# Patient Record
Sex: Male | Born: 1980 | Race: Black or African American | Hispanic: No | Marital: Single | Smoking: Former smoker
Health system: Southern US, Community
[De-identification: ages and names within clinical notes are randomized; demographics above are authoritative.]

## PROBLEM LIST (undated history)

## (undated) DIAGNOSIS — W409XXA Explosion of unspecified explosive materials, initial encounter: Secondary | ICD-10-CM

## (undated) HISTORY — PX: EYE SURGERY: SHX253

---

## 2005-05-17 ENCOUNTER — Emergency Department (HOSPITAL_COMMUNITY): Admission: EM | Admit: 2005-05-17 | Discharge: 2005-05-17 | Payer: Self-pay | Admitting: Emergency Medicine

## 2009-10-08 ENCOUNTER — Emergency Department (HOSPITAL_BASED_OUTPATIENT_CLINIC_OR_DEPARTMENT_OTHER): Admission: EM | Admit: 2009-10-08 | Discharge: 2009-10-08 | Payer: Self-pay | Admitting: Emergency Medicine

## 2014-01-11 ENCOUNTER — Emergency Department (HOSPITAL_BASED_OUTPATIENT_CLINIC_OR_DEPARTMENT_OTHER)
Admission: EM | Admit: 2014-01-11 | Discharge: 2014-01-12 | Disposition: A | Discharge status: Home / Self Care | Payer: Self-pay | Attending: Emergency Medicine | Admitting: Emergency Medicine

## 2014-01-11 ENCOUNTER — Encounter (HOSPITAL_BASED_OUTPATIENT_CLINIC_OR_DEPARTMENT_OTHER): Payer: Self-pay | Admitting: Emergency Medicine

## 2014-01-11 DIAGNOSIS — N390 Urinary tract infection, site not specified: Secondary | ICD-10-CM | POA: Insufficient documentation

## 2014-01-11 DIAGNOSIS — F17200 Nicotine dependence, unspecified, uncomplicated: Secondary | ICD-10-CM | POA: Insufficient documentation

## 2014-01-11 DIAGNOSIS — A630 Anogenital (venereal) warts: Secondary | ICD-10-CM | POA: Insufficient documentation

## 2014-01-11 HISTORY — DX: Explosion of unspecified explosive materials, initial encounter: W40.9XXA

## 2014-01-11 LAB — URINALYSIS W MICROSCOPIC + REFLEX CULTURE
Bilirubin Urine: NEGATIVE
GLUCOSE, UA: NEGATIVE mg/dL
Hgb urine dipstick: NEGATIVE
Ketones, ur: NEGATIVE mg/dL
NITRITE: NEGATIVE
PROTEIN: NEGATIVE mg/dL
Specific Gravity, Urine: 1.019 (ref 1.005–1.030)
Urobilinogen, UA: 1 mg/dL (ref 0.0–1.0)
pH: 6.5 (ref 5.0–8.0)

## 2014-01-11 MED ORDER — METRONIDAZOLE 500 MG PO TABS
2000.0000 mg | ORAL_TABLET | ORAL | Status: AC
  Administered 2014-01-11: 2000 mg via ORAL
  Filled 2014-01-11: qty 4

## 2014-01-11 MED ORDER — CIPROFLOXACIN HCL 500 MG PO TABS: 500.0000 mg | ORAL_TABLET | Freq: Two times a day (BID) | ORAL | Status: DC

## 2014-01-11 MED ORDER — CEFTRIAXONE SODIUM 250 MG IJ SOLR
250.0000 mg | Freq: Once | INTRAMUSCULAR | Status: AC
  Administered 2014-01-11: 250 mg via INTRAMUSCULAR
  Filled 2014-01-11: qty 250

## 2014-01-11 MED ORDER — AZITHROMYCIN 250 MG PO TABS
1000.0000 mg | ORAL_TABLET | Freq: Once | ORAL | Status: AC
  Administered 2014-01-11: 1000 mg via ORAL
  Filled 2014-01-11: qty 4
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
## 2014-01-11 NOTE — ED Notes (Signed)
Patient states that he is not sure if he wants the ordered medications at this time or not. Spoke with the patient about the importance of the drugs and will be back to find out what he decides.

## 2014-01-11 NOTE — ED Notes (Signed)
Noticed a bump on the end of his penis ten minutes ago when using the restroom.  C/o pain with urination.  Is sexually active, one male partner x one year, uses condoms intermittently.

## 2015-06-22 ENCOUNTER — Ambulatory Visit (INDEPENDENT_AMBULATORY_CARE_PROVIDER_SITE_OTHER): Payer: Self-pay | Admitting: Emergency Medicine

## 2015-06-22 ENCOUNTER — Ambulatory Visit (INDEPENDENT_AMBULATORY_CARE_PROVIDER_SITE_OTHER): Payer: Self-pay

## 2015-06-22 VITALS — BP 110/72 | HR 104 | Temp 102.2°F | Resp 20 | Ht 70.0 in | Wt 154.0 lb

## 2015-06-22 DIAGNOSIS — J189 Pneumonia, unspecified organism: Secondary | ICD-10-CM

## 2015-06-22 MED ORDER — PROMETHAZINE-CODEINE 6.25-10 MG/5ML PO SYRP: 5.0000 mL | ORAL_SOLUTION | Freq: Four times a day (QID) | ORAL | Status: DC | PRN

## 2015-06-22 MED ORDER — CLARITHROMYCIN 500 MG PO TABS: 500.0000 mg | ORAL_TABLET | Freq: Two times a day (BID) | ORAL | Status: DC
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
## 2015-06-22 NOTE — Patient Instructions (Signed)

## 2019-09-25 ENCOUNTER — Other Ambulatory Visit: Payer: Self-pay

## 2019-09-25 ENCOUNTER — Encounter (HOSPITAL_COMMUNITY): Payer: Self-pay

## 2019-09-25 ENCOUNTER — Ambulatory Visit (HOSPITAL_COMMUNITY)
Admission: EM | Admit: 2019-09-25 | Discharge: 2019-09-25 | Disposition: A | Discharge status: Home / Self Care | Payer: Self-pay | Attending: Family Medicine | Admitting: Family Medicine

## 2019-09-25 DIAGNOSIS — M62838 Other muscle spasm: Secondary | ICD-10-CM

## 2019-09-25 DIAGNOSIS — S161XXA Strain of muscle, fascia and tendon at neck level, initial encounter: Secondary | ICD-10-CM

## 2019-09-25 MED ORDER — CYCLOBENZAPRINE HCL 5 MG PO TABS: 5.0000 mg | ORAL_TABLET | Freq: Two times a day (BID) | ORAL | 0 refills | Status: DC | PRN

## 2019-09-25 MED ORDER — NAPROXEN 500 MG PO TABS: 500.0000 mg | ORAL_TABLET | Freq: Two times a day (BID) | ORAL | 0 refills | Status: DC

## 2019-09-25 NOTE — ED Triage Notes (Signed)
Pt presents to UC w/ c/o neck pain from neck to mid shoulder area x3 weeks. Pt states he woke up one day with the pain. Pt has tried hot/cold compresses.
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
## 2019-09-25 NOTE — Discharge Instructions (Signed)
Please use Naprosyn twice daily with food as needed for neck pain  You may use flexeril as needed to help with pain. This is a muscle relaxer and causes sedation- please use only at bedtime or when you will be home and not have to drive/work  Gentle neck stretching-see attached  Please follow-up if symptoms not resolving or worsening

## 2020-08-13 ENCOUNTER — Ambulatory Visit (HOSPITAL_COMMUNITY)
Admission: EM | Admit: 2020-08-13 | Discharge: 2020-08-13 | Disposition: A | Discharge status: Home / Self Care | Payer: No Typology Code available for payment source | Attending: Emergency Medicine | Admitting: Emergency Medicine

## 2020-08-13 ENCOUNTER — Other Ambulatory Visit: Payer: Self-pay

## 2020-08-13 ENCOUNTER — Encounter (HOSPITAL_COMMUNITY): Payer: Self-pay | Admitting: Emergency Medicine

## 2020-08-13 DIAGNOSIS — Z20822 Contact with and (suspected) exposure to covid-19: Secondary | ICD-10-CM | POA: Diagnosis present

## 2020-08-13 DIAGNOSIS — B349 Viral infection, unspecified: Secondary | ICD-10-CM | POA: Diagnosis present

## 2020-08-13 LAB — SARS CORONAVIRUS 2 (TAT 6-24 HRS): SARS Coronavirus 2: POSITIVE — AB

## 2020-08-13 NOTE — Discharge Instructions (Addendum)
Your COVID 19 results will be available in 24-48 hours. Given your home positive test and clinical symptoms, you are likely positive for COVID-19.   Please continue isolation at home for at least 10 days since the start of your symptoms. If you do not have symptoms, please isolate at home for 10 days from the day you were tested. Once you complete your 10 day quarantine, you may return to normal activities as long as you've not had a fever for over 24 hours(without taking fever reducing medicine) and your symptoms are improving. End quarantine date 08/24/20.  Please continue good preventive care measures, including:  frequent hand-washing, avoid touching your face, cover coughs/sneezes, stay out of crowds and keep a 6 foot distance from others.  Go to the nearest hospital emergency room if you experience severe chest pain, shortness of breath, or any other worrisome symptoms thought to be life threatening.
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
## 2020-08-13 NOTE — ED Triage Notes (Signed)
Pt states he had fever and chills onset last Wednesday. Pt states he went out of town last weekend and then on Monday he had fever and chills again. On Tuesday he loss his taste and smell. Pt states on Wednesday he vomited a lot of phlegm and his taste returned. Pt states he took a home covid test yesterday and it was positive.

## 2020-08-14 ENCOUNTER — Telehealth: Payer: Self-pay | Admitting: Infectious Diseases

## 2020-08-14 NOTE — Telephone Encounter (Signed)
Called to Discuss with patient about Covid symptoms and the use of the monoclonal antibody infusion for those with mild to moderate Covid symptoms and at a high risk of hospitalization.     Pt appears to qualify for this infusion due to co-morbid conditions and/or a member of an at-risk group in accordance with the FDA Emergency Use Authorization.   Sx started for him Friday 9/3 with sweating and other upper respiratory infections related to allergies.  He is feeling better and declined infusion at this time.    Rexene Alberts, MSN, NP-C [facility]  [email or web address] Pager: [phone] Office: [phone] [facility] Main Line: [phone]

## 2021-03-10 ENCOUNTER — Ambulatory Visit (HOSPITAL_COMMUNITY)
Admission: EM | Admit: 2021-03-10 | Discharge: 2021-03-10 | Disposition: A | Discharge status: Home / Self Care | Payer: No Typology Code available for payment source | Attending: Family Medicine | Admitting: Family Medicine

## 2021-03-10 ENCOUNTER — Encounter (HOSPITAL_COMMUNITY): Payer: Self-pay

## 2021-03-10 ENCOUNTER — Other Ambulatory Visit: Payer: Self-pay

## 2021-03-10 DIAGNOSIS — M62838 Other muscle spasm: Secondary | ICD-10-CM

## 2021-03-10 MED ORDER — CYCLOBENZAPRINE HCL 10 MG PO TABS: ORAL_TABLET | ORAL | 0 refills | Status: DC

## 2021-03-10 MED ORDER — NAPROXEN 500 MG PO TABS: 500.0000 mg | ORAL_TABLET | Freq: Two times a day (BID) | ORAL | 0 refills | Status: DC

## 2021-03-10 NOTE — ED Triage Notes (Signed)
Pt in with c/o muscle spasms in his shoulders that started about 4 days ago after a day in the gym  Pt has been using warm and cold compress for relief
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
## 2021-03-17 ENCOUNTER — Ambulatory Visit (HOSPITAL_COMMUNITY)
Admission: EM | Admit: 2021-03-17 | Discharge: 2021-03-17 | Disposition: A | Discharge status: Home / Self Care | Payer: Self-pay | Attending: Physician Assistant | Admitting: Physician Assistant

## 2021-03-17 ENCOUNTER — Encounter (HOSPITAL_COMMUNITY): Payer: Self-pay

## 2021-03-17 ENCOUNTER — Ambulatory Visit (INDEPENDENT_AMBULATORY_CARE_PROVIDER_SITE_OTHER): Payer: Self-pay

## 2021-03-17 ENCOUNTER — Other Ambulatory Visit: Payer: Self-pay

## 2021-03-17 DIAGNOSIS — M5412 Radiculopathy, cervical region: Secondary | ICD-10-CM

## 2021-03-17 DIAGNOSIS — Y9239 Other specified sports and athletic area as the place of occurrence of the external cause: Secondary | ICD-10-CM

## 2021-03-17 DIAGNOSIS — M542 Cervicalgia: Secondary | ICD-10-CM

## 2021-03-17 MED ORDER — METHOCARBAMOL 500 MG PO TABS: 500.0000 mg | ORAL_TABLET | Freq: Two times a day (BID) | ORAL | 0 refills | Status: AC

## 2021-03-17 MED ORDER — PREDNISONE 10 MG (21) PO TBPK: ORAL_TABLET | ORAL | 0 refills | Status: AC

## 2021-03-17 NOTE — Discharge Instructions (Addendum)
Please do not drive or drink alcohol with methocarbamol as this can make you sleepy.  He should not take any NSAIDs (aspirin,ibuprofen/Advil, naproxen/Aleve) with prednisone due to risk of GI bleeding.  Continue using heat and stretch.  Someone to contact you to establish with PCP as we discussed to consider more advanced imaging.

## 2021-03-17 NOTE — ED Triage Notes (Signed)
Pt in with c/o shoulder pain and neck pain that has been going on for 1 week  Pt has been taking muscle relaxer and pain relief medication with minimal relief
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
## 2021-03-23 ENCOUNTER — Encounter: Payer: Self-pay | Admitting: *Deleted

## 2021-06-30 IMAGING — DX DG CERVICAL SPINE COMPLETE 4+V
6 series · 6 of 6 positions shown · non-contrast
Comparison: None

CLINICAL DATA: Neck pain for 2 weeks after working out at the gym,
limited range of motion, pain with turning head from side to side
and up and down, neck pain with radiculopathy

EXAM:
CERVICAL SPINE - COMPLETE 4+ VIEW

[c-spine lat]
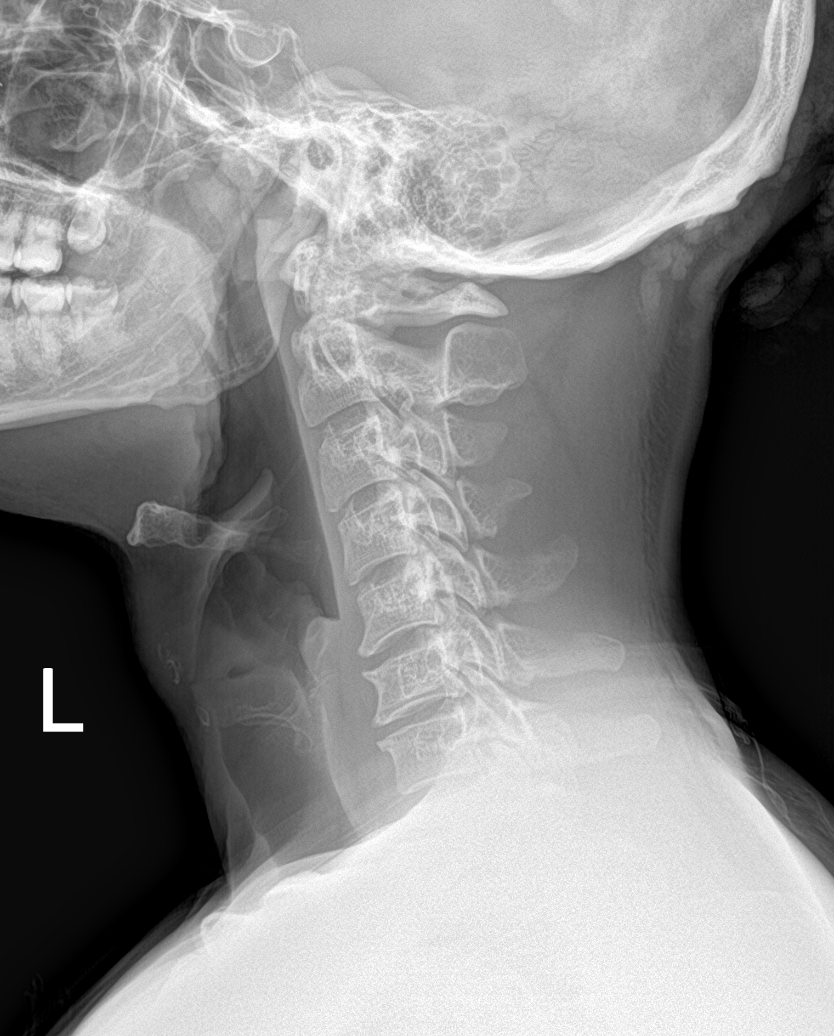

[c-spine obl]
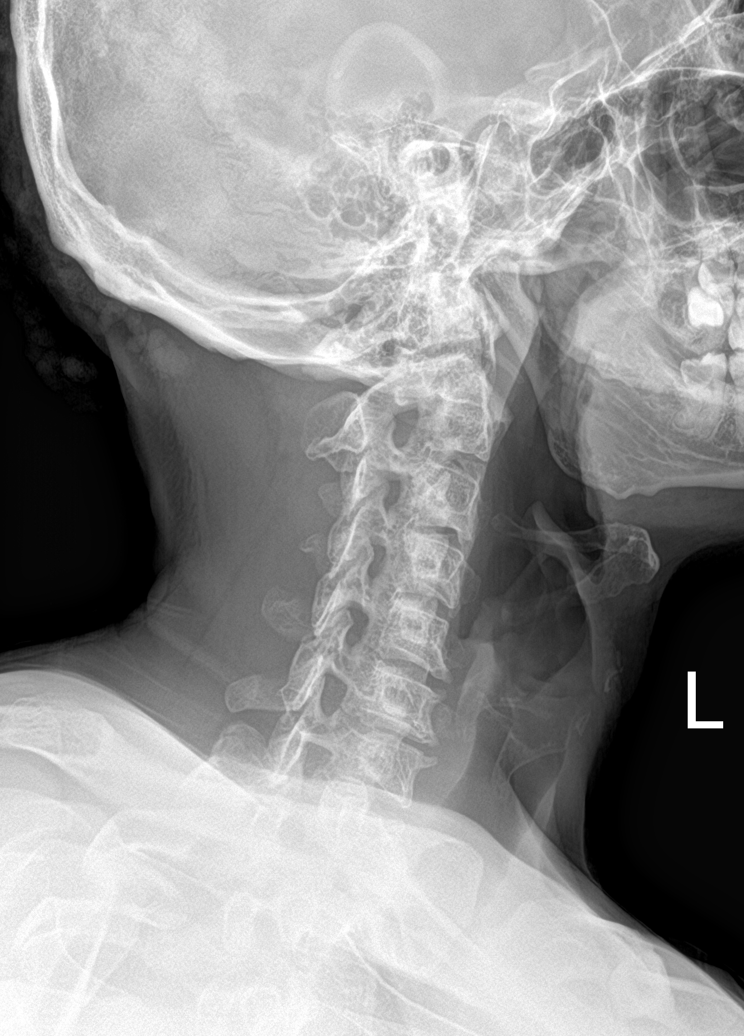

[c-spine ap (1 of 2)]
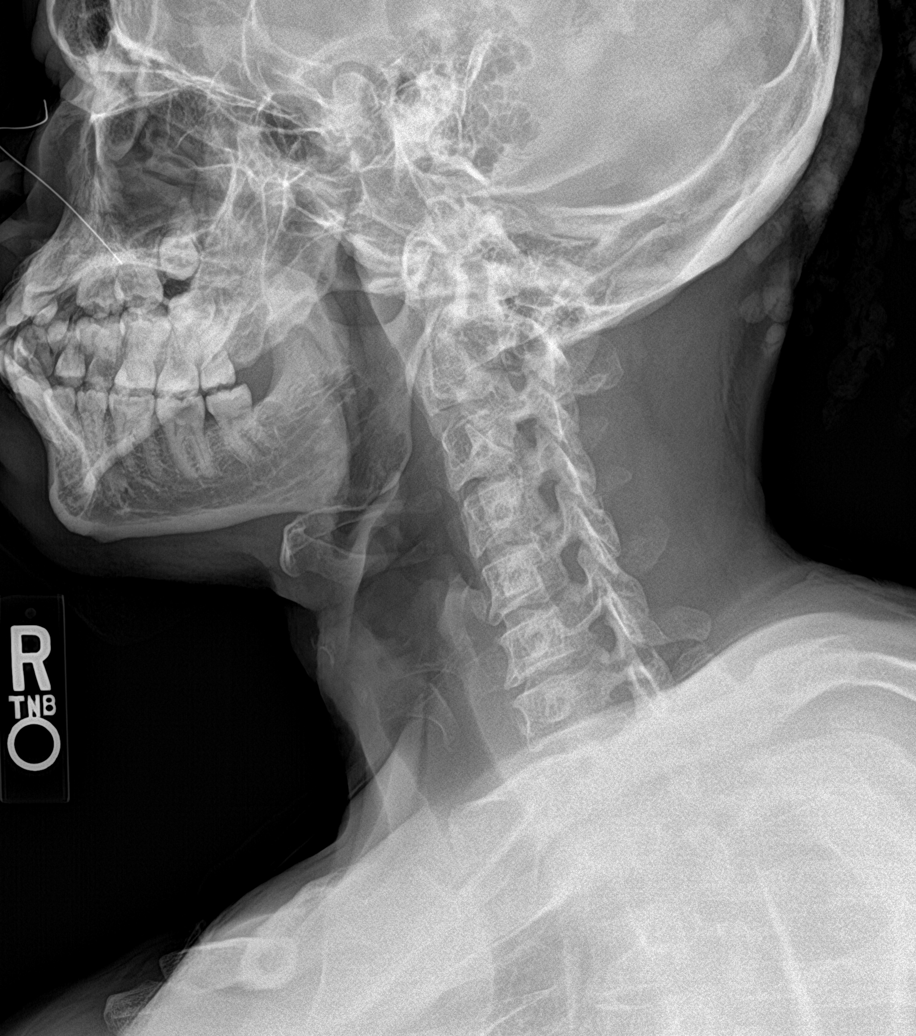

[c-spine open mouth]
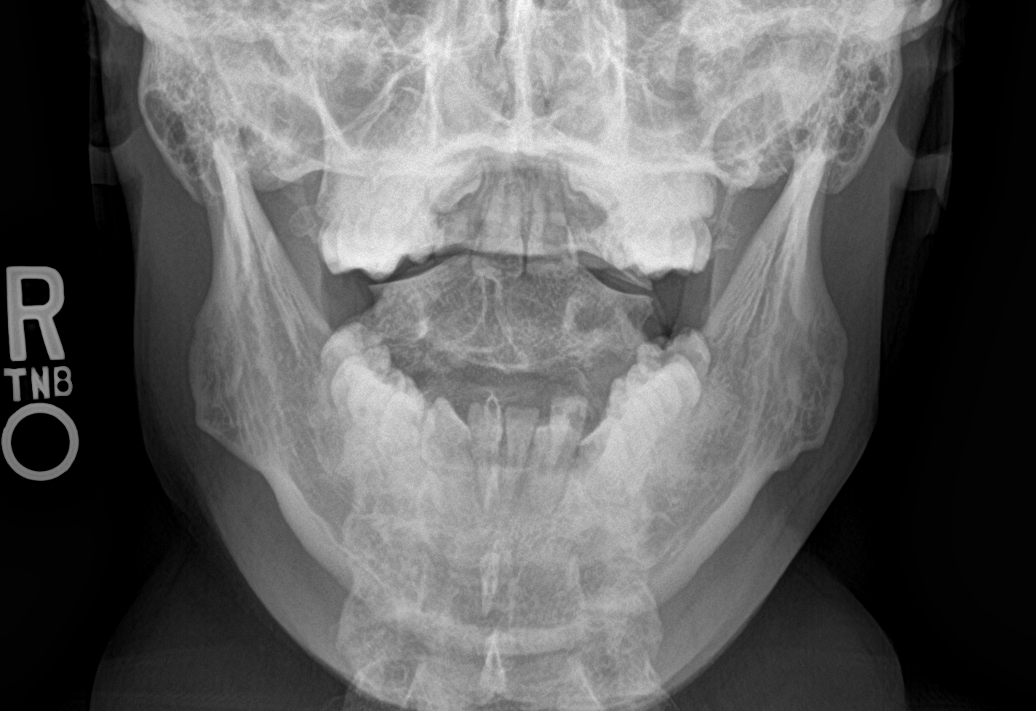

[c-spine swimmers]
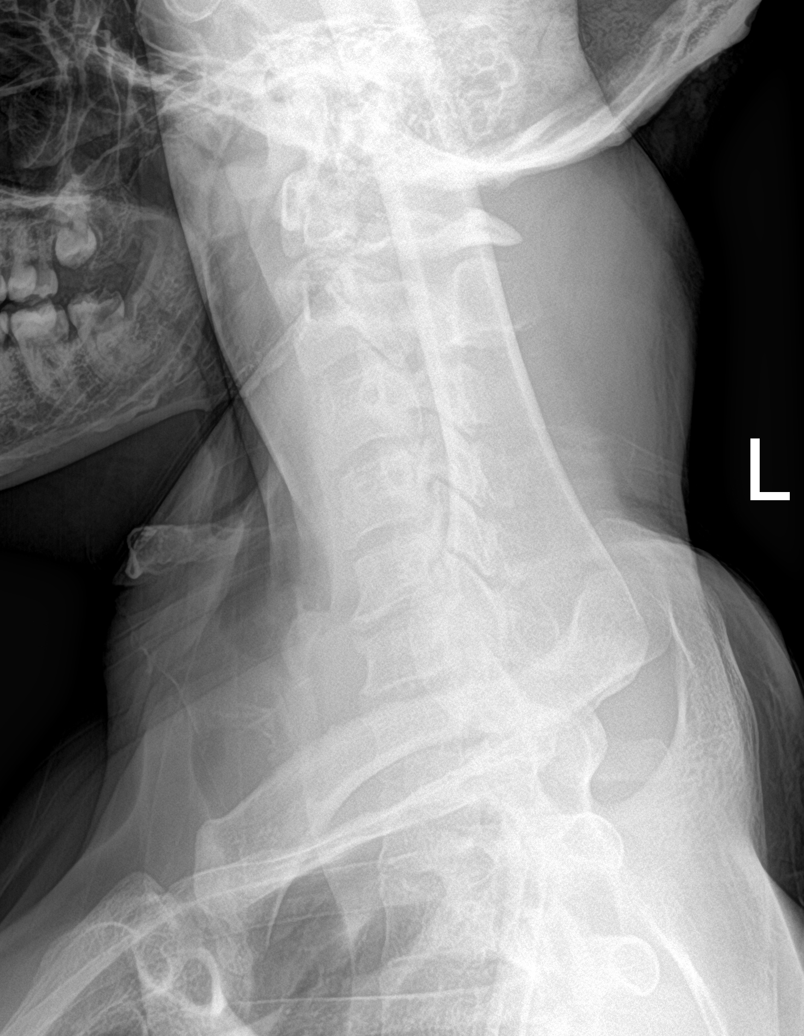

[c-spine ap (2 of 2)]
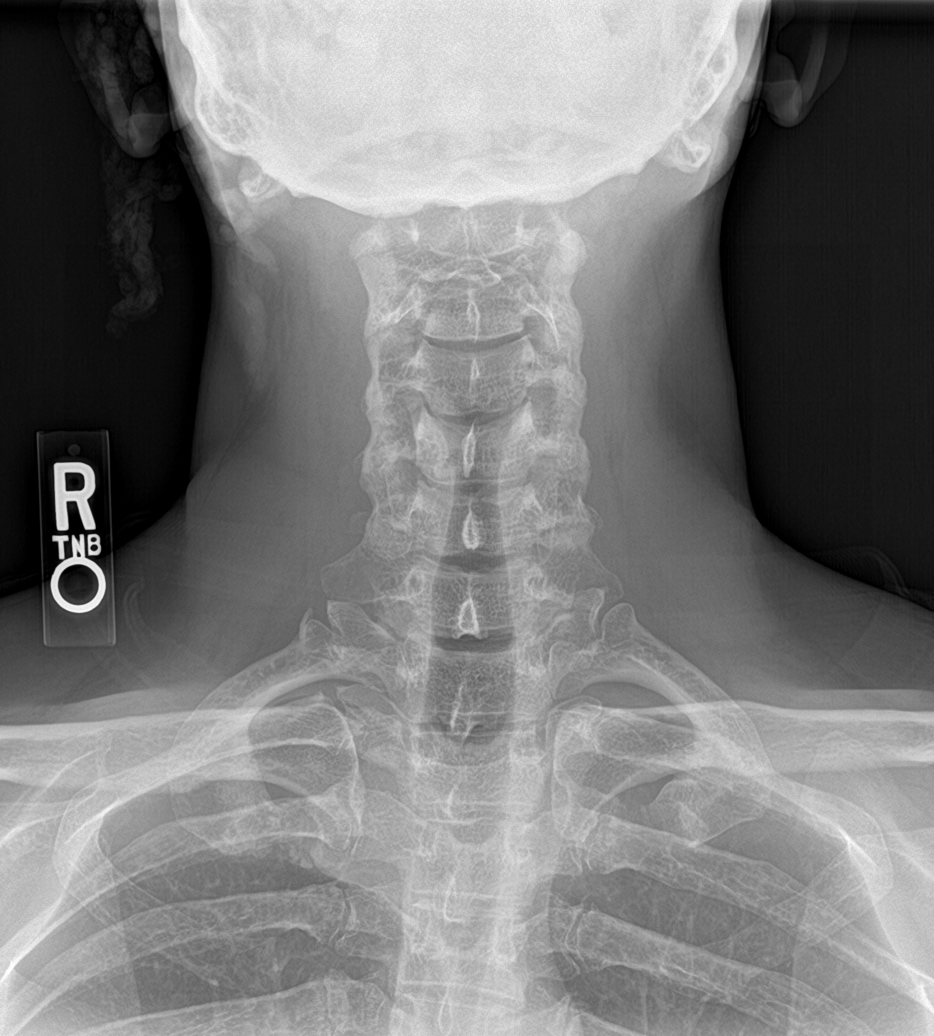

[6 of 6 positions shown; findings below may reference images not displayed]

FINDINGS: Slight reversal of cervical lordosis question muscle spasm.

Vertebral body heights maintained.

Prevertebral soft tissues normal thickness.

Disc space narrowing with mild endplate spur formation at C5-C6 and
C6-C7.

No fracture, subluxation, or bone destruction.

Slight encroachment upon cervical neural foramina bilaterally by at
mild degrees of uncovertebral and facet hypertrophy.

Lung apices clear.
IMPRESSION: Mild degenerative disc disease changes of the cervical spine.

No acute abnormalities.
